# Patient Record
Sex: Female | Born: 1960 | Race: White | Hispanic: No | Marital: Married | Smoking: Former smoker
Health system: Southern US, Community
[De-identification: ages and names within clinical notes are randomized; demographics above are authoritative.]

## PROBLEM LIST (undated history)

## (undated) DIAGNOSIS — I10 Essential (primary) hypertension: Secondary | ICD-10-CM

---

## 2011-06-23 ENCOUNTER — Ambulatory Visit: Payer: Self-pay

## 2011-06-23 LAB — RAPID INFLUENZA A&B ANTIGENS

## 2013-05-20 ENCOUNTER — Ambulatory Visit: Payer: Self-pay | Admitting: Emergency Medicine

## 2016-03-16 ENCOUNTER — Ambulatory Visit
Admission: EM | Admit: 2016-03-16 | Discharge: 2016-03-16 | Disposition: A | Discharge status: Home / Self Care | Payer: BLUE CROSS/BLUE SHIELD

## 2016-03-16 ENCOUNTER — Encounter: Payer: Self-pay | Admitting: *Deleted

## 2016-03-16 DIAGNOSIS — R69 Illness, unspecified: Secondary | ICD-10-CM

## 2016-03-16 DIAGNOSIS — J111 Influenza due to unidentified influenza virus with other respiratory manifestations: Secondary | ICD-10-CM

## 2016-03-16 HISTORY — DX: Essential (primary) hypertension: I10

## 2016-03-16 LAB — RAPID INFLUENZA A&B ANTIGENS: Influenza A (ARMC): NEGATIVE

## 2016-03-16 LAB — RAPID STREP SCREEN (MED CTR MEBANE ONLY): STREPTOCOCCUS, GROUP A SCREEN (DIRECT): NEGATIVE

## 2016-03-16 LAB — RAPID INFLUENZA A&B ANTIGENS (ARMC ONLY): INFLUENZA B (ARMC): NEGATIVE

## 2016-03-16 MED ORDER — OSELTAMIVIR PHOSPHATE 75 MG PO CAPS: 75.0000 mg | ORAL_CAPSULE | Freq: Two times a day (BID) | ORAL | 0 refills | Status: DC

## 2016-03-16 MED ORDER — HYDROCOD POLST-CPM POLST ER 10-8 MG/5ML PO SUER: 5.0000 mL | Freq: Every evening | ORAL | 0 refills | Status: DC | PRN

## 2016-03-16 MED ORDER — BENZONATATE 100 MG PO CAPS: 100.0000 mg | ORAL_CAPSULE | Freq: Three times a day (TID) | ORAL | 0 refills | Status: DC | PRN

## 2016-03-16 NOTE — ED Triage Notes (Signed)
Onset of chills and body aches 12/25 then on 12/26 sore throat, productive cough- green, fever, fatigue that has worsened since onset.

## 2016-03-16 NOTE — ED Provider Notes (Signed)
[facility] ____________________________________________  Time seen: Approximately 5:36 PM  I have reviewed the triage vital signs and the nursing notes.   HISTORY  Chief Complaint Sore Throat; Fever; and Cough   HPI Lisa Love is a 55 y.o. female  presenting for the complaints of quick onset 3 days ago of runny nose, nasal congestion, chills, body aches. Patient reports gradual onset of sore throat and increased cough. States cough is occasionally productive of yellow-green mucus but mostly nonproductive. Reports clear runny nose. Reports possible fevers. Reports has been taken frequent over-the-counter Tylenol and ibuprofen and DayQuil. Reports last took ibuprofen approximately 5 hours ago. Reports multiple sick contacts at work, denies home sick contacts, but reports has been now with similar symptoms. Reports over-the-counter medications help without resolution. Patient states that overall she does feel better today than she did yesterday.  Denies chest pain, shortness of breath, abdominal pain, dysuria, extremity pain, extremity swelling, recent sickness or recent hospitalization. Denies cardiac history. Denies renal insufficiency.  No LMP recorded. Patient is postmenopausal.  TOBIN, REBECCA B, MD: PCP  Past Medical History:  Diagnosis Date  . Hypertension     There are no active problems to display for this patient.   Past Surgical History:  Procedure Laterality Date  . CESAREAN SECTION      No current facility-administered medications for this encounter.   Current Outpatient Prescriptions:  .  amLODipine (NORVASC) 10 MG tablet, Take 10 mg by mouth daily., Disp: , Rfl:  .  pantoprazole (PROTONIX) 40 MG tablet, Take 40 mg by mouth daily., Disp: , Rfl:  .  benzonatate (TESSALON PERLES) 100 MG capsule, Take 1 capsule (100 mg total) by mouth 3 (three) times daily as needed., Disp: 15 capsule, Rfl: 0 .  chlorpheniramine-HYDROcodone (TUSSIONEX PENNKINETIC  ER) 10-8 MG/5ML SUER, Take 5 mLs by mouth at bedtime as needed for cough. do not drive or operate machinery while taking as can cause drowsiness., Disp: 75 mL, Rfl: 0 .  oseltamivir (TAMIFLU) 75 MG capsule, Take 1 capsule (75 mg total) by mouth every 12 (twelve) hours., Disp: 10 capsule, Rfl: 0  Allergies Amoxicillin  History reviewed. No pertinent family history.  Social History Social History  Substance Use Topics  . Smoking status: Former Games developermoker  . Smokeless tobacco: Never Used  . Alcohol use Yes    Review of Systems Constitutional: As above. Eyes: No visual changes. ENT: As above. Cardiovascular: Denies chest pain. Respiratory: Denies shortness of breath. Gastrointestinal: No abdominal pain.  No nausea, no vomiting.  No diarrhea.  No constipation. Genitourinary: Negative for dysuria. Musculoskeletal: Negative for back pain. Skin: Negative for rash. Neurological: Negative for headaches, focal weakness or numbness.  10-point ROS otherwise negative.  ____________________________________________   PHYSICAL EXAM:  VITAL SIGNS: ED Triage Vitals  Enc Vitals Group     BP 03/16/16 1630 (!) 165/98     Pulse Rate 03/16/16 1630 78     Resp 03/16/16 1630 16     Temp 03/16/16 1630 99.2 F (37.3 C)     Temp Source 03/16/16 1630 Oral     SpO2 03/16/16 1630 99 %     Weight 03/16/16 1632 160 lb (72.6 kg)     Height 03/16/16 1632 5\' 2"  (1.575 m)     Head Circumference --      Peak Flow --      Pain Score 03/16/16 1718 3     Pain Loc --      Pain Edu? --  Excl. in GC? --   Patient states that she missed a dose of her blood pressure medicine today and will take it as soon as she gets home.  Constitutional: Alert and oriented. Well appearing and in no acute distress. Eyes: Conjunctivae are normal. PERRL. EOMI. Head: Atraumatic. No sinus tenderness to palpation. No swelling. No erythema.  Ears: no erythema, normal TMs bilaterally.   Nose:Nasal congestion with clear  rhinorrhea  Mouth/Throat: Mucous membranes are moist. Mild pharyngeal erythema. No tonsillar swelling or exudate.  Neck: No stridor.  No cervical spine tenderness to palpation. Hematological/Lymphatic/Immunilogical: No cervical lymphadenopathy. Cardiovascular: Normal rate, regular rhythm. Grossly normal heart sounds.  Good peripheral circulation. Respiratory: Normal respiratory effort.  No retractions. Lungs CTAB.No wheezes, rales or rhonchi. Good air movement.  Gastrointestinal: Soft and nontender. No CVA tenderness. Musculoskeletal: Ambulatory with steady gait. No cervical, thoracic or lumbar tenderness to palpation. Neurologic:  Normal speech and language. No gait instability. Skin:  Skin is warm, dry and intact. No rash noted. Psychiatric: Mood and affect are normal. Speech and behavior are normal. ________________________________________   LABS (all labs ordered are listed, but only abnormal results are displayed)  Labs Reviewed  RAPID INFLUENZA A&B ANTIGENS ([facility] ONLY)  RAPID STREP [facility]CREEN [facility])  CULTURE, GROUP A STREP [facility])    PROCEDURES Procedures   INITIAL IMPRESSION / ASSESSMENT AND PLAN / ED COURSE  Pertinent labs & imaging results that were available during my care of the patient were reviewed by me and considered in my medical decision making (see chart for details).  Overall well-appearing patient. No acute distress. Suspect influenza-like illness. Influenza negative. Quick strep negative, will culture. However discussed in detail with patient treatment options with patient, suspect influenza-like illness or influenza. Will treat patient with when necessary Tessalon Perles, when necessary Tussionex and Tamiflu. Encourage rest, fluids and PCP follow-up as needed. Encourage patient to continue to monitor her blood pressure at home and take medication as prescribed. Patient reports blood pressure has been well controlled at home with most recent at home 117  /70s.Discussed indication, risks and benefits of medications with patient.   [place] controlled substance database reviewed, no controlled substances documented in last 6 months.  Discussed follow up with Primary care physician this week. Discussed follow up and return parameters including no resolution or any worsening concerns. Patient verbalized understanding and agreed to plan.   ____________________________________________   FINAL CLINICAL IMPRESSION(S) / ED DIAGNOSES  Final diagnoses:  Influenza-like illness     Discharge Medication List as of 03/16/2016  5:14 PM    START taking these medications   Details  benzonatate (TESSALON PERLES) 100 MG capsule Take 1 capsule (100 mg total) by mouth 3 (three) times daily as needed., Starting Thu 03/16/2016, Normal    chlorpheniramine-HYDROcodone (TUSSIONEX PENNKINETIC ER) 10-8 MG/5ML SUER Take 5 mLs by mouth at bedtime as needed for cough. do not drive or operate machinery while taking as can cause drowsiness., Starting Thu 03/16/2016, Print    oseltamivir (TAMIFLU) 75 MG capsule Take 1 capsule (75 mg total) by mouth every 12 (twelve) hours., Starting Thu 03/16/2016, Normal        Note: This dictation was prepared with Dragon dictation along with smaller phrase technology. Any transcriptional errors that result from this process are unintentional.    Clinical Course       Renford DillsLindsey Nur Rabold, NP 03/16/16 1742

## 2016-03-16 NOTE — Discharge Instructions (Signed)
Take medication as prescribed. Rest. Drink plenty of fluids.  ° °Follow up with your primary care physician this week as needed. Return to Urgent care for new or worsening concerns.  ° °

## 2016-03-19 LAB — CULTURE, GROUP A STREP (THRC)

## 2016-04-14 ENCOUNTER — Encounter: Payer: Self-pay | Admitting: Emergency Medicine

## 2016-04-14 ENCOUNTER — Ambulatory Visit (INDEPENDENT_AMBULATORY_CARE_PROVIDER_SITE_OTHER): Payer: BLUE CROSS/BLUE SHIELD

## 2016-04-14 ENCOUNTER — Ambulatory Visit
Admission: EM | Admit: 2016-04-14 | Discharge: 2016-04-14 | Disposition: A | Discharge status: Home / Self Care | Payer: BLUE CROSS/BLUE SHIELD | Attending: Family Medicine | Admitting: Family Medicine

## 2016-04-14 DIAGNOSIS — B9789 Other viral agents as the cause of diseases classified elsewhere: Secondary | ICD-10-CM

## 2016-04-14 DIAGNOSIS — J069 Acute upper respiratory infection, unspecified: Secondary | ICD-10-CM | POA: Diagnosis not present

## 2016-04-14 MED ORDER — PREDNISONE 20 MG PO TABS: 20.0000 mg | ORAL_TABLET | Freq: Every day | ORAL | 0 refills | Status: AC

## 2016-04-14 MED ORDER — HYDROCOD POLST-CPM POLST ER 10-8 MG/5ML PO SUER: 5.0000 mL | Freq: Every evening | ORAL | 0 refills | Status: AC | PRN

## 2016-04-14 MED ORDER — ALBUTEROL SULFATE HFA 108 (90 BASE) MCG/ACT IN AERS: 2.0000 | INHALATION_SPRAY | Freq: Four times a day (QID) | RESPIRATORY_TRACT | 0 refills | Status: AC | PRN

## 2016-04-14 NOTE — ED Triage Notes (Signed)
Patient c/o cough and chest congestion since Tuesday.  Patient that she had the flu a month ago. Patient denies fevers.

## 2016-04-14 NOTE — Discharge Instructions (Signed)
Take medication as prescribed. Rest. Drink plenty of fluids.  ° °Follow up with your primary care physician this week as needed. Return to Urgent care for new or worsening concerns.  ° °

## 2016-04-14 NOTE — ED Provider Notes (Signed)
[facility] ____________________________________________  Time seen: Approximately 3:01 PM  I have reviewed the triage vital signs and the nursing notes.   HISTORY  Chief Complaint Cough   HPI Lisa Love is a 56 y.o. female present for the complaints of cough and congestion for the last 3 days. Patient states she began with cough and then had gradual onset of runny nose. Patient states cough is primarily a dry cough, occasionally productive. Denies hemoptysis. Patient reports continues with normal energy levels. Denies fevers, chills, body aches, weakness or dizziness. Reports continues to eat and drink well. Reports her husband recently had similar this past week. Patient reports occasionally taking over-the-counter medications which help. Patient reports coming in today as she has occasionally noticed herself wheeze.  Patient reports that one month ago she was seen in urgent care for flulike symptoms, and reports a that time she was having fevers and worsening complaints. Patient states that she fully improved from her previous sickness and was doing well. Patient reports that she had returned to exercising and feels that her flulike illness fully resolved shortly after onset. Denies other recent sickness. Patient states she does not feel like she has the flu.  Denies chest pain, shortness of breath, chest pain with deep breath, abdominal pain, dysuria, extremity pain, extremity swelling or rash.  Denies recent antibiotic use.   Kathlen Brunswick, MD: PCP   Past Medical History:  Diagnosis Date  . Hypertension     There are no active problems to display for this patient.   Past Surgical History:  Procedure Laterality Date  . CESAREAN SECTION       No current facility-administered medications for this encounter.   Current Outpatient Prescriptions:  .  rosuvastatin (CRESTOR) 10 MG tablet, Take 10 mg by mouth daily., Disp: , Rfl:  .  albuterol (PROVENTIL  HFA;VENTOLIN HFA) 108 (90 Base) MCG/ACT inhaler, Inhale 2 puffs into the lungs every 6 (six) hours as needed for wheezing or shortness of breath., Disp: 1 Inhaler, Rfl: 0 .  amLODipine (NORVASC) 10 MG tablet, Take 10 mg by mouth daily., Disp: , Rfl:  .  chlorpheniramine-HYDROcodone (TUSSIONEX PENNKINETIC ER) 10-8 MG/5ML SUER, Take 5 mLs by mouth at bedtime as needed for cough. do not drive or operate machinery while taking as can cause drowsiness., Disp: 75 mL, Rfl: 0 .  pantoprazole (PROTONIX) 40 MG tablet, Take 40 mg by mouth daily., Disp: , Rfl:  .  predniSONE (DELTASONE) 20 MG tablet, Take 1 tablet (20 mg total) by mouth daily., Disp: 3 tablet, Rfl: 0  Allergies Amoxicillin  History reviewed. No pertinent family history.  Social History Social History  Substance Use Topics  . Smoking status: Former Games developer  . Smokeless tobacco: Never Used  . Alcohol use Yes    Review of Systems Constitutional: No fever/chills Eyes: No visual changes. ENT: No sore throat. Cardiovascular: Denies chest pain. Respiratory: Denies shortness of breath. Gastrointestinal: No abdominal pain.  No nausea, no vomiting.  No diarrhea.  No constipation. Genitourinary: Negative for dysuria. Musculoskeletal: Negative for back pain. Skin: Negative for rash. Neurological: Negative for headaches, focal weakness or numbness.  10-point ROS otherwise negative.  ____________________________________________   PHYSICAL EXAM:  VITAL SIGNS: ED Triage Vitals  Enc Vitals Group     BP 04/14/16 1428 (!) 150/85     Pulse Rate 04/14/16 1428 85     Resp 04/14/16 1428 16     Temp 04/14/16 1428 98.2 F (36.8 C)  Temp Source 04/14/16 1428 Oral     SpO2 04/14/16 1428 100 %     Weight 04/14/16 1426 155 lb (70.3 kg)     Height 04/14/16 1426 5\' 2"  (1.575 m)     Head Circumference --      Peak Flow --      Pain Score 04/14/16 1427 0     Pain Loc --      Pain Edu? --      Excl. in GC? --    Constitutional: Alert  and oriented. Well appearing and in no acute distress. Eyes: Conjunctivae are normal. PERRL. EOMI. Head: Atraumatic. No sinus tenderness to palpation. No swelling. No erythema.  Ears: no erythema, normal TMs bilaterally.   Nose:Nasal congestion with clear rhinorrhea  Mouth/Throat: Mucous membranes are moist. No pharyngeal erythema. No tonsillar swelling or exudate.  Neck: No stridor.  No cervical spine tenderness to palpation. Hematological/Lymphatic/Immunilogical: No cervical lymphadenopathy. Cardiovascular: Normal rate, regular rhythm. Grossly normal heart sounds.  Good peripheral circulation. Respiratory: Normal respiratory effort.  No retractions. No rales or rhonchi. Mild bilateral lower expiratory wheezes noted. Dry intermittent cough. Speaks in complete sentences. Good air movement.  Gastrointestinal: Soft and nontender. No CVA tenderness.  Musculoskeletal: Ambulatory with steady gait. No cervical, thoracic or lumbar tenderness to palpation. Neurologic:  Normal speech and language. No gait instability. Skin:  Skin appears warm, dry and intact. No rash noted. Psychiatric: Mood and affect are normal. Speech and behavior are normal.  ___________________________________________   LABS (all labs ordered are listed, but only abnormal results are displayed)  Labs Reviewed - No data to display  RADIOLOGY  Dg Chest 2 View  Result Date: 04/14/2016 CLINICAL DATA:  Cough and wheezing for 3 days EXAM: CHEST  2 VIEW COMPARISON:  05/20/2013 FINDINGS: Cardiomediastinal silhouette is stable. No infiltrate or pleural effusion. No pulmonary edema. Bony thorax is unremarkable. IMPRESSION: No active cardiopulmonary disease. Electronically Signed   By: Natasha Mead M.D.   On: 04/14/2016 15:19   ____________________________________________   PROCEDURES Procedures     INITIAL IMPRESSION / ASSESSMENT AND PLAN / ED COURSE  Pertinent labs & imaging results that were available during my care of the  patient were reviewed by me and considered in my medical decision making (see chart for details).  Well-appearing patient. No acute distress. 3 days of runny nose, nasal congestion and cough. Expiratory wheezes noted. Discussed in detail with patient suspect viral illness. However discussed with patient patient has had recent influenza and now with cough, will obtain chest x-ray.  Chest x-ray per radiologist no acute cardiopulmonary disease. Discussed x-ray findings with patient. Discussed with patient as acute onset, no fevers and clinical appearance, discussed no clear indication for antibiotics at this time. Discussed and recommended supportive treatment. Will treat patient with 3 day course of oral prednisone, and when necessary albuterol inhaler and when necessary Tussionex. Patient reports that she still has some Tessalon Perles at home and will take during daytime as needed. Encourage rest, fluids and supportive care. Discussed strict follow-up and return parameters.  Discussed follow up with Primary care physician this week. Discussed follow up and return parameters including no resolution or any worsening concerns. Patient verbalized understanding and agreed to plan.   ____________________________________________   FINAL CLINICAL IMPRESSION(S) / ED DIAGNOSES  Final diagnoses:  Viral URI with cough     Discharge Medication List as of 04/14/2016  3:34 PM    START taking these medications   Details  albuterol (PROVENTIL HFA;VENTOLIN HFA)  108 (90 Base) MCG/ACT inhaler Inhale 2 puffs into the lungs every 6 (six) hours as needed for wheezing or shortness of breath., Starting Fri 04/14/2016, Normal    chlorpheniramine-HYDROcodone (TUSSIONEX PENNKINETIC ER) 10-8 MG/5ML SUER Take 5 mLs by mouth at bedtime as needed for cough. do not drive or operate machinery while taking as can cause drowsiness., Starting Fri 04/14/2016, Print    predniSONE (DELTASONE) 20 MG tablet Take 1 tablet (20 mg  total) by mouth daily., Starting Fri 04/14/2016, Until Mon 04/17/2016, Normal        Note: This dictation was prepared with Dragon dictation along with smaller phrase technology. Any transcriptional errors that result from this process are unintentional.         Renford DillsLindsey Bobbiejo Ishikawa, NP 04/14/16 [phone]

## 2017-09-14 IMAGING — CR DG CHEST 2V
2 series · 2 of 2 positions shown · non-contrast
Comparison: 05/20/2013

CLINICAL DATA: Cough and wheezing for 3 days

EXAM:
CHEST  2 VIEW

[chest pa]
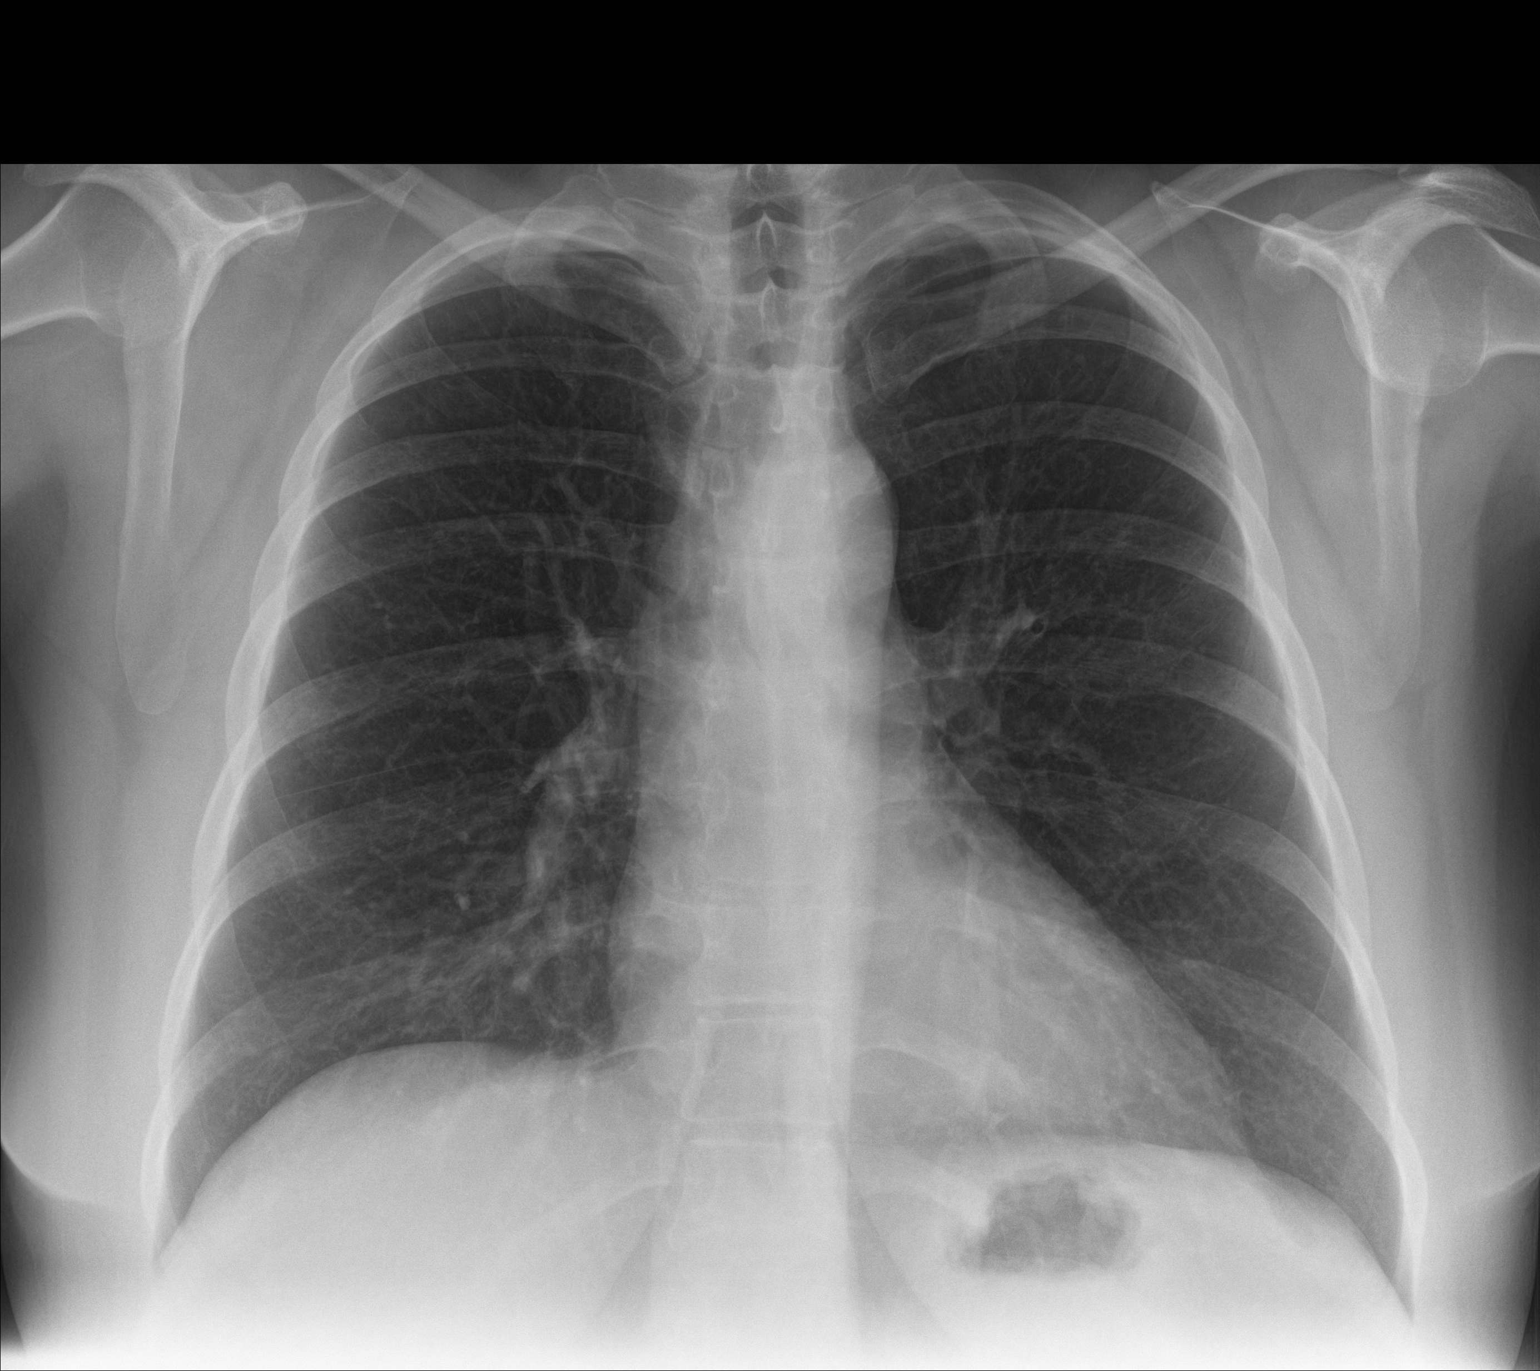

[chest lat]
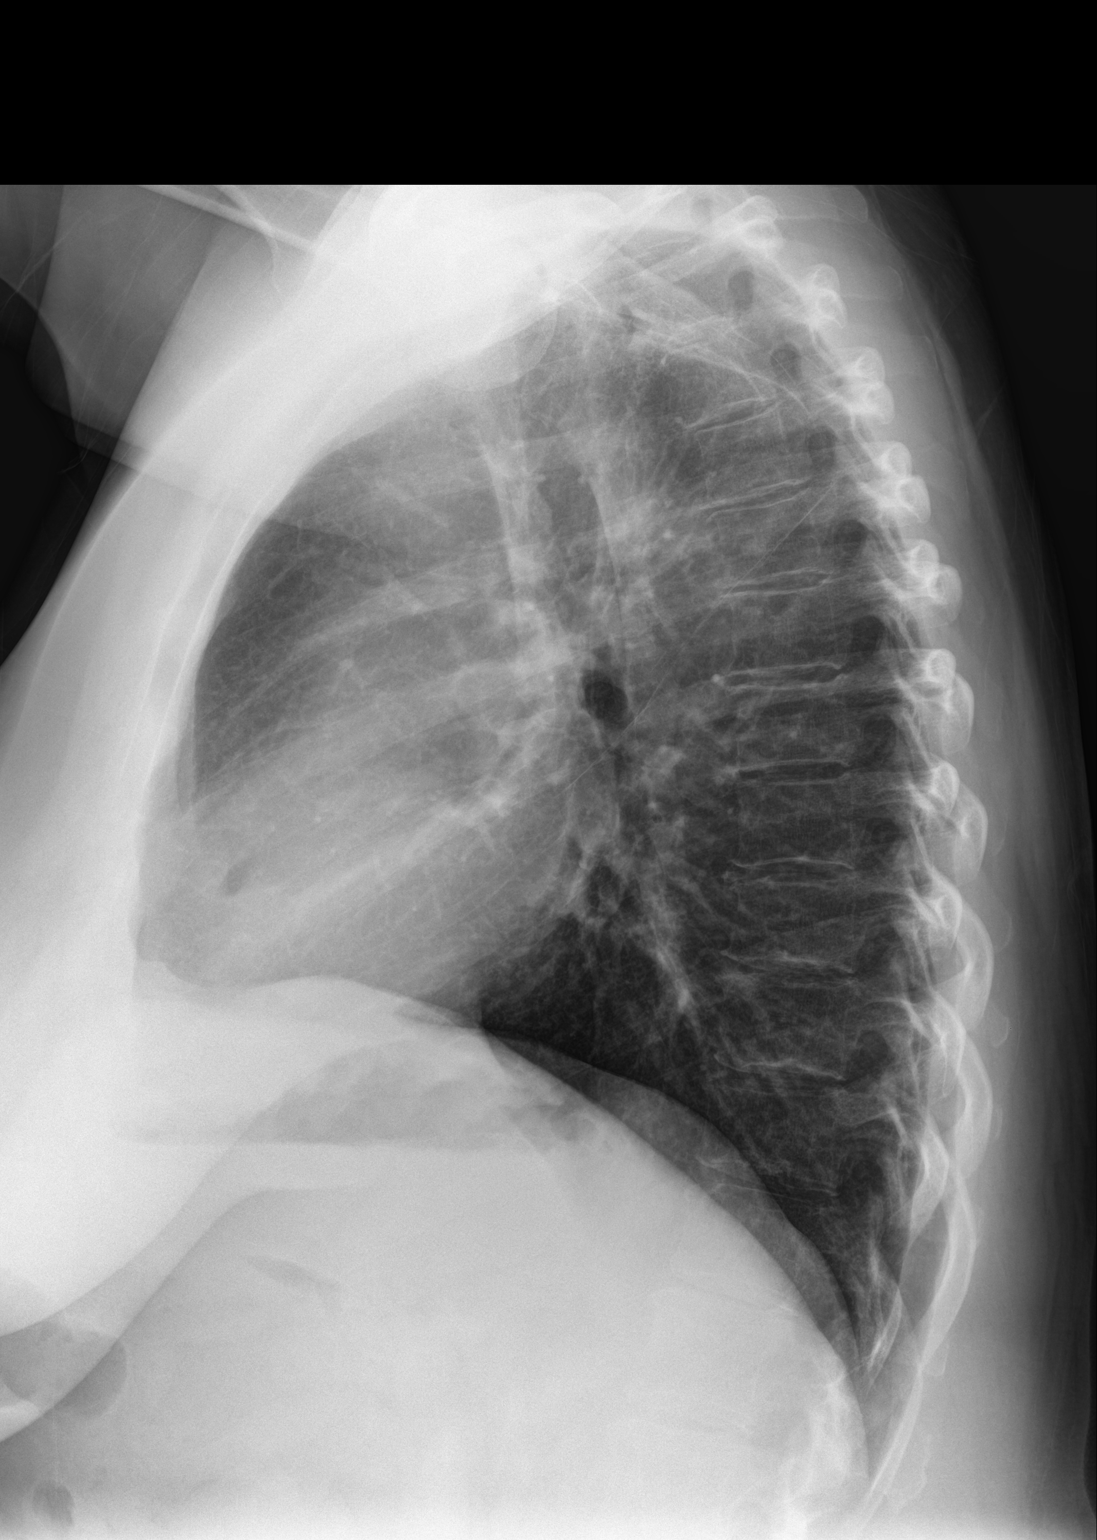

[2 of 2 positions shown; findings below may reference images not displayed]

FINDINGS: Cardiomediastinal silhouette is stable. No infiltrate or pleural
effusion. No pulmonary edema. Bony thorax is unremarkable.
IMPRESSION: No active cardiopulmonary disease.

## 2019-10-04 ENCOUNTER — Ambulatory Visit (INDEPENDENT_AMBULATORY_CARE_PROVIDER_SITE_OTHER): Payer: BC Managed Care – PPO

## 2019-10-04 ENCOUNTER — Other Ambulatory Visit: Payer: Self-pay

## 2019-10-04 ENCOUNTER — Ambulatory Visit
Admission: EM | Admit: 2019-10-04 | Discharge: 2019-10-04 | Disposition: A | Discharge status: Home / Self Care | Payer: BC Managed Care – PPO | Attending: Family Medicine | Admitting: Family Medicine

## 2019-10-04 ENCOUNTER — Encounter: Payer: Self-pay | Admitting: Emergency Medicine

## 2019-10-04 DIAGNOSIS — M25531 Pain in right wrist: Secondary | ICD-10-CM

## 2019-10-04 NOTE — ED Provider Notes (Signed)
[facility] ____________________________________________  Time seen: Approximately 10:43 AM  I have reviewed the triage vital signs and the nursing notes.   HISTORY  Chief Complaint Wrist Pain   HPI Lisa Love is a 59 y.o. female presenting for evaluation of right wrist pain after injury that occurred early this morning.  Patient states that she got up to go to the restroom, tripped over the end of her foot board and caught herself with her right wrist.  Reports she has had continued right wrist pain.  Denies other injuries.  Denies loss of consciousness.  States right hand dominant.  States had right wrist swelling quickly, but applied ice and the swelling did somewhat improve.  Denies pain radiation, paresthesias or other injury.  States pain is mostly to the thumb side of her wrist and does hurt to move her thumb.  Still able to make a fist.  Denies other aggravating alleviating factors.  Reports otherwise doing well.    Past Medical History:  Diagnosis Date  . Hypertension     There are no problems to display for this patient.   Past Surgical History:  Procedure Laterality Date  . CESAREAN SECTION       No current facility-administered medications for this encounter.  Current Outpatient Medications:  .  amLODipine (NORVASC) 10 MG tablet, Take 10 mg by mouth daily., Disp: , Rfl:  .  escitalopram (LEXAPRO) 10 MG tablet, TAKE (1) TABLET BY MOUTH EVERY DAY, Disp: , Rfl:  .  pantoprazole (PROTONIX) 40 MG tablet, Take 40 mg by mouth daily., Disp: , Rfl:  .  rosuvastatin (CRESTOR) 10 MG tablet, Take 10 mg by mouth daily., Disp: , Rfl:  .  albuterol (PROVENTIL HFA;VENTOLIN HFA) 108 (90 Base) MCG/ACT inhaler, Inhale 2 puffs into the lungs every 6 (six) hours as needed for wheezing or shortness of breath., Disp: 1 Inhaler, Rfl: 0 .  chlorpheniramine-HYDROcodone (TUSSIONEX PENNKINETIC ER) 10-8 MG/5ML SUER, Take 5 mLs by mouth at bedtime as needed for cough. do not  drive or operate machinery while taking as can cause drowsiness., Disp: 75 mL, Rfl: 0  Allergies Amoxicillin  Family History  Problem Relation Age of Onset  . Hypertension Father     Social History Social History   Tobacco Use  . Smoking status: Former Games developer  . Smokeless tobacco: Never Used  Vaping Use  . Vaping Use: Never used  Substance Use Topics  . Alcohol use: Yes  . Drug use: Not on file    Review of Systems Constitutional: No fever ENT: No sore throat. Cardiovascular: Denies chest pain. Respiratory: Denies shortness of breath. Musculoskeletal: Positive right wrist pain. Skin: Negative for rash. Neurological: Negative for focal weakness or numbness.  ____________________________________________   PHYSICAL EXAM:  VITAL SIGNS: ED Triage Vitals  Enc Vitals Group     BP 10/04/19 0919 (!) 141/92     Pulse Rate 10/04/19 0919 75     Resp 10/04/19 0919 14     Temp 10/04/19 0919 98.2 F (36.8 C)     Temp Source 10/04/19 0919 Oral     SpO2 10/04/19 0919 100 %     Weight 10/04/19 0917 150 lb (68 kg)     Height 10/04/19 0917 5\' 2"  (1.575 m)     Head Circumference --      Peak Flow --      Pain Score 10/04/19 0917 5     Pain Loc --      Pain Edu? --  Excl. in GC? --     Constitutional: Alert and oriented. Well appearing and in no acute distress. Eyes: Conjunctivae are normal.  ENT      Head: Normocephalic and atraumatic. Cardiovascular:  Good peripheral circulation. Respiratory: Normal respiratory effort without tachypnea nor retractions.  Musculoskeletal: Steady gait.  Bilateral distal radial pulses equal and easily palpated.  Bilateral hand grip strong, left greater than right. Except: Right distal wrist pain radial aspect and tenderness at anatomical snuffbox with mild localized swelling, mild pain with right thumb resisted flexion and extension but full range of motion present, right hand normal distal sensation capillary refill with good strength.   Right upper extremity otherwise nontender. Neurologic:  Normal speech and language. No gross focal neurologic deficits are appreciated. Speech is normal. No gait instability.  Skin:  Skin is warm, dry and intact. No rash noted. Psychiatric: Mood and affect are normal. Speech and behavior are normal. Patient exhibits appropriate insight and judgment   ___________________________________________   LABS (all labs ordered are listed, but only abnormal results are displayed)  Labs Reviewed - No data to display ____________________________________________   RADIOLOGY  DG Wrist Complete Right  Result Date: 10/04/2019 CLINICAL DATA:  59 year old female with a history of a fall, with wrist pain EXAM: RIGHT WRIST - COMPLETE 3+ VIEW COMPARISON:  None. FINDINGS: The scaphoid view demonstrates possible cortical irregularity at the interface with the capitate. No acute fracture of the distal radius or distal ulna. No radiopaque foreign body. Carpal bones are aligned. Questionable increased scapholunate distance. No comparison available IMPRESSION: The scaphoid view demonstrates questionable cortical irregularity of the scaphoid at the interface with the capitate, potentially nondisplaced scaphoid fracture. Correlation with point tenderness at the anatomic snuffbox may be useful, as well as consideration referral for follow-up MRI. Questionable widening of the scapholunate interval, potentially indicating ligamentous injury. Referral for follow-up MRI may be useful. Electronically Signed   By: Gilmer Mor D.O.   On: 10/04/2019 10:00   ____________________________________________   PROCEDURES Procedures   Right wrist thumb spica splint applied by nursing staff. INITIAL IMPRESSION / ASSESSMENT AND PLAN / ED COURSE  Pertinent labs & imaging results that were available during my care of the patient were reviewed by me and considered in my medical decision making (see chart for  details).  Well-appearing patient.  No acute distress.  Right wrist pain post mechanical injury.  Patient with snuffbox tenderness with above x-ray, no clear fracture, concern for scaphoid fracture.  Thumb spica splint applied.  Directed patient to patient to keep wrist in splint and to follow-up with orthopedic this week.  Ice, elevation.  Patient declined need for prescription pain medication.  Agreed to plan.  Discussed follow up with Primary care physician this week. Discussed follow up and return parameters including no resolution or any worsening concerns. Patient verbalized understanding and agreed to plan.   ____________________________________________   FINAL CLINICAL IMPRESSION(S) / ED DIAGNOSES  Final diagnoses:  Right wrist pain     ED Discharge Orders    None       Note: This dictation was prepared with Dragon dictation along with smaller phrase technology. Any transcriptional errors that result from this process are unintentional.         Renford Dills, NP 10/04/19 1050

## 2019-10-04 NOTE — ED Triage Notes (Signed)
Patient states that she fell and tried to catch herself with her right hand around 3am this morning.  Patient c/o pain in her right wrist.

## 2019-10-04 NOTE — Discharge Instructions (Signed)
Keep in splint.  Ice.  Elevate.  Follow-up with orthopedic this coming week.  Close follow-up is important.  See above to call to schedule.  Follow up with your primary care physician this week as needed. Return to Urgent care for new or worsening concerns.
